# Patient Record
Sex: Male | Born: 2000 | Race: Black or African American | Hispanic: No | Marital: Single | Smoking: Current some day smoker
Health system: Southern US, Community
[De-identification: ages and names within clinical notes are randomized; demographics above are authoritative.]

## PROBLEM LIST (undated history)

## (undated) DIAGNOSIS — F909 Attention-deficit hyperactivity disorder, unspecified type: Secondary | ICD-10-CM

## (undated) HISTORY — PX: TONSILLECTOMY: SUR1361

---
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
## 2020-07-26 IMAGING — CR RIGHT HAND - COMPLETE 3+ VIEW
3 series · 3 of 3 positions shown · non-contrast
Comparison: Radiographs April 03, 2018.

CLINICAL DATA: Right hand pain and swelling after punching wall 2
days ago.

EXAM:
RIGHT HAND - COMPLETE 3+ VIEW

[x hand pa right]
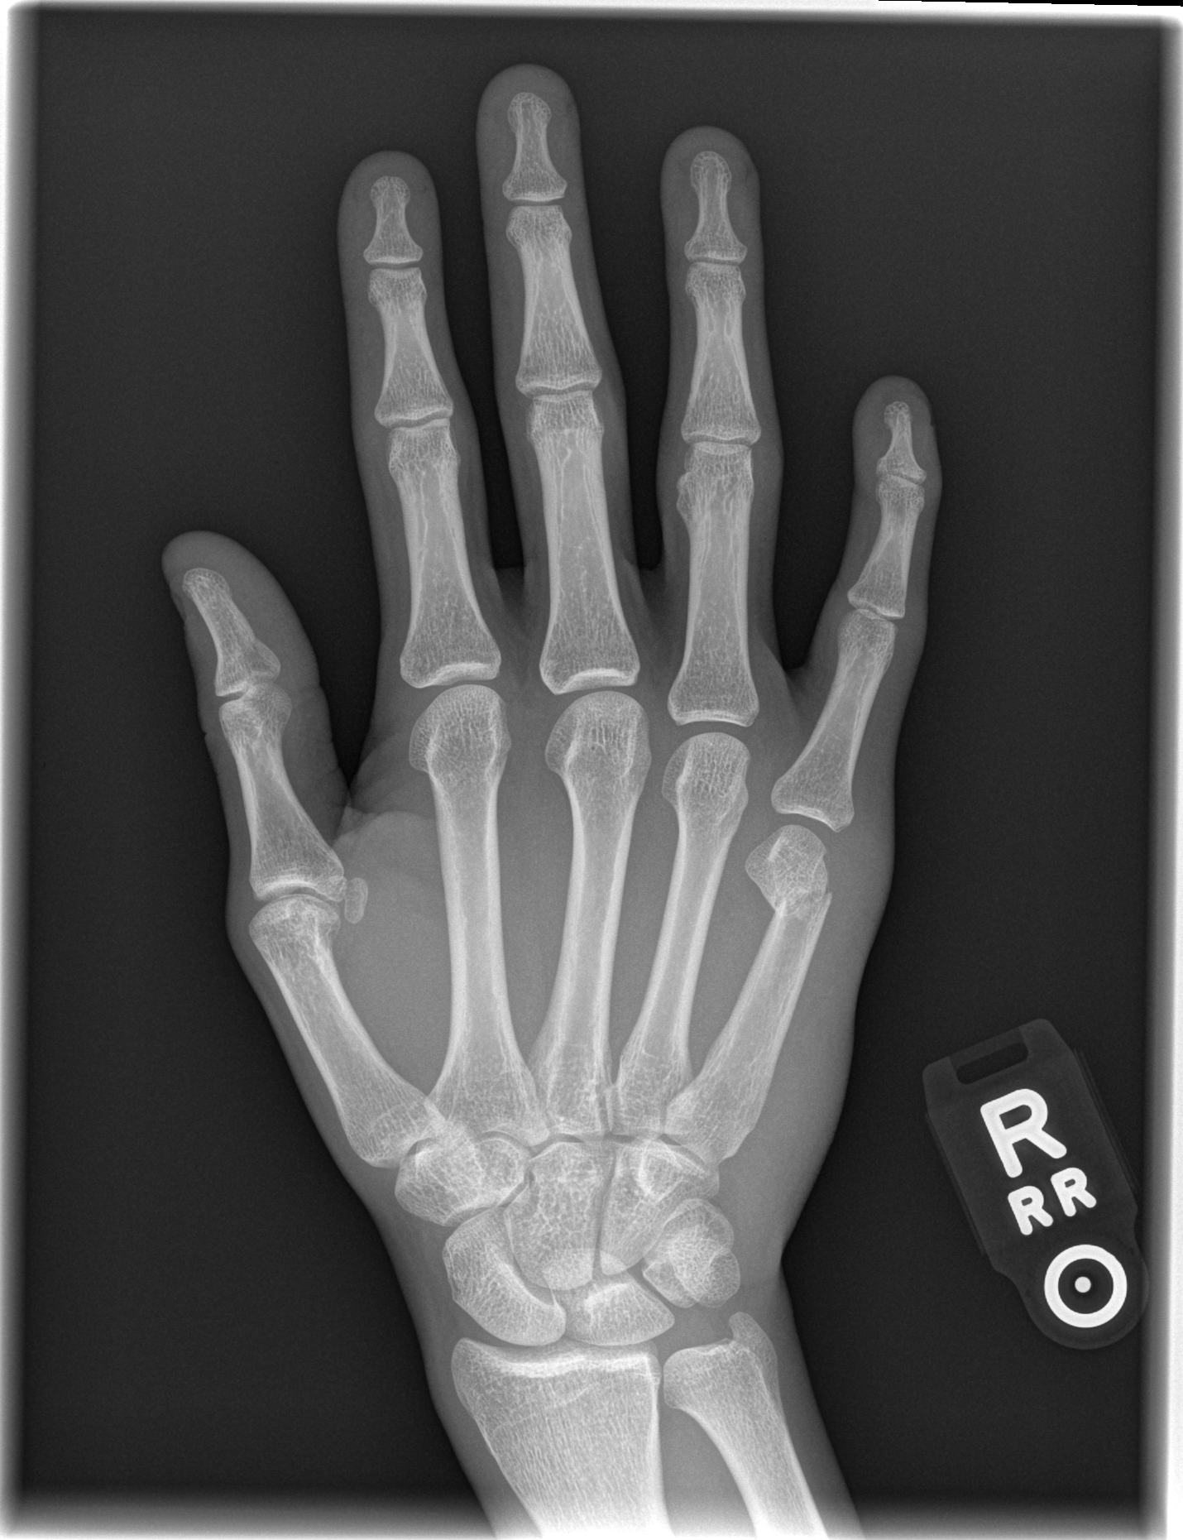

[x hand oblique right]
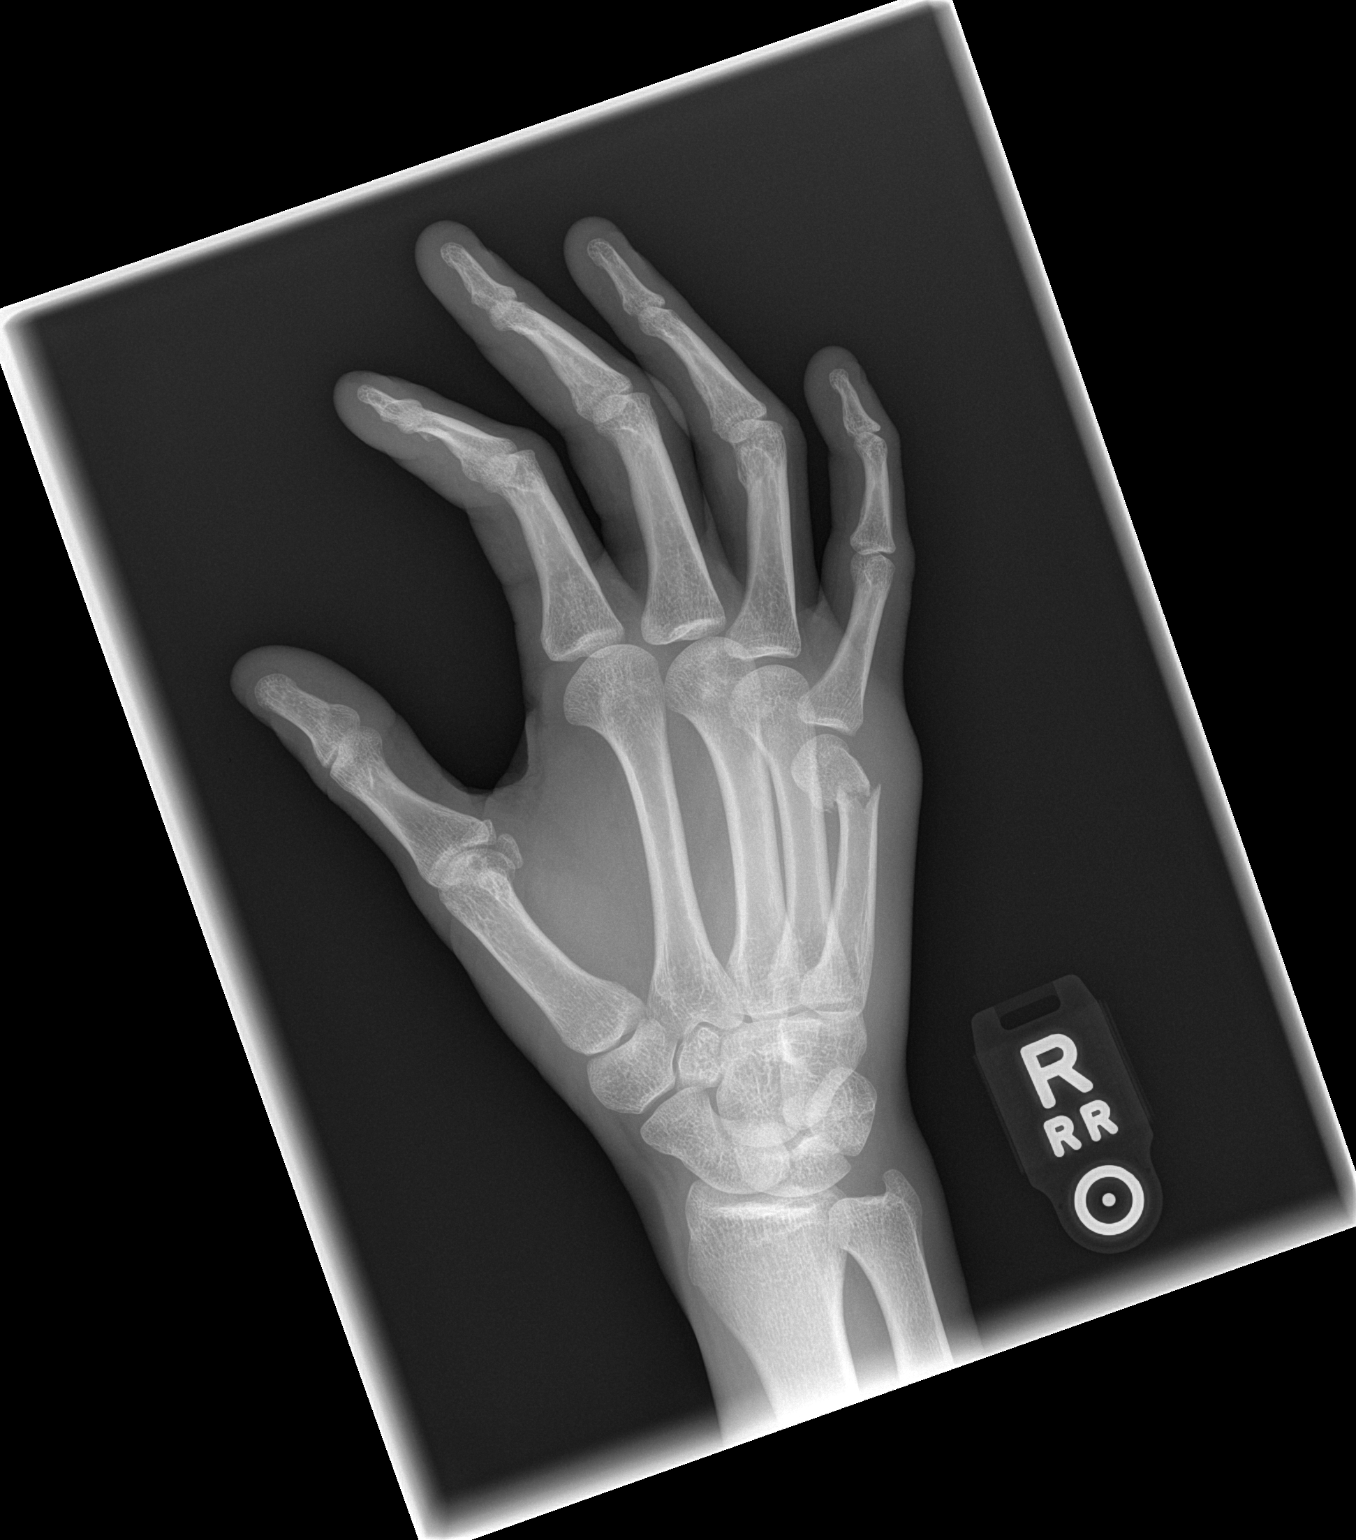

[x hand lat right]
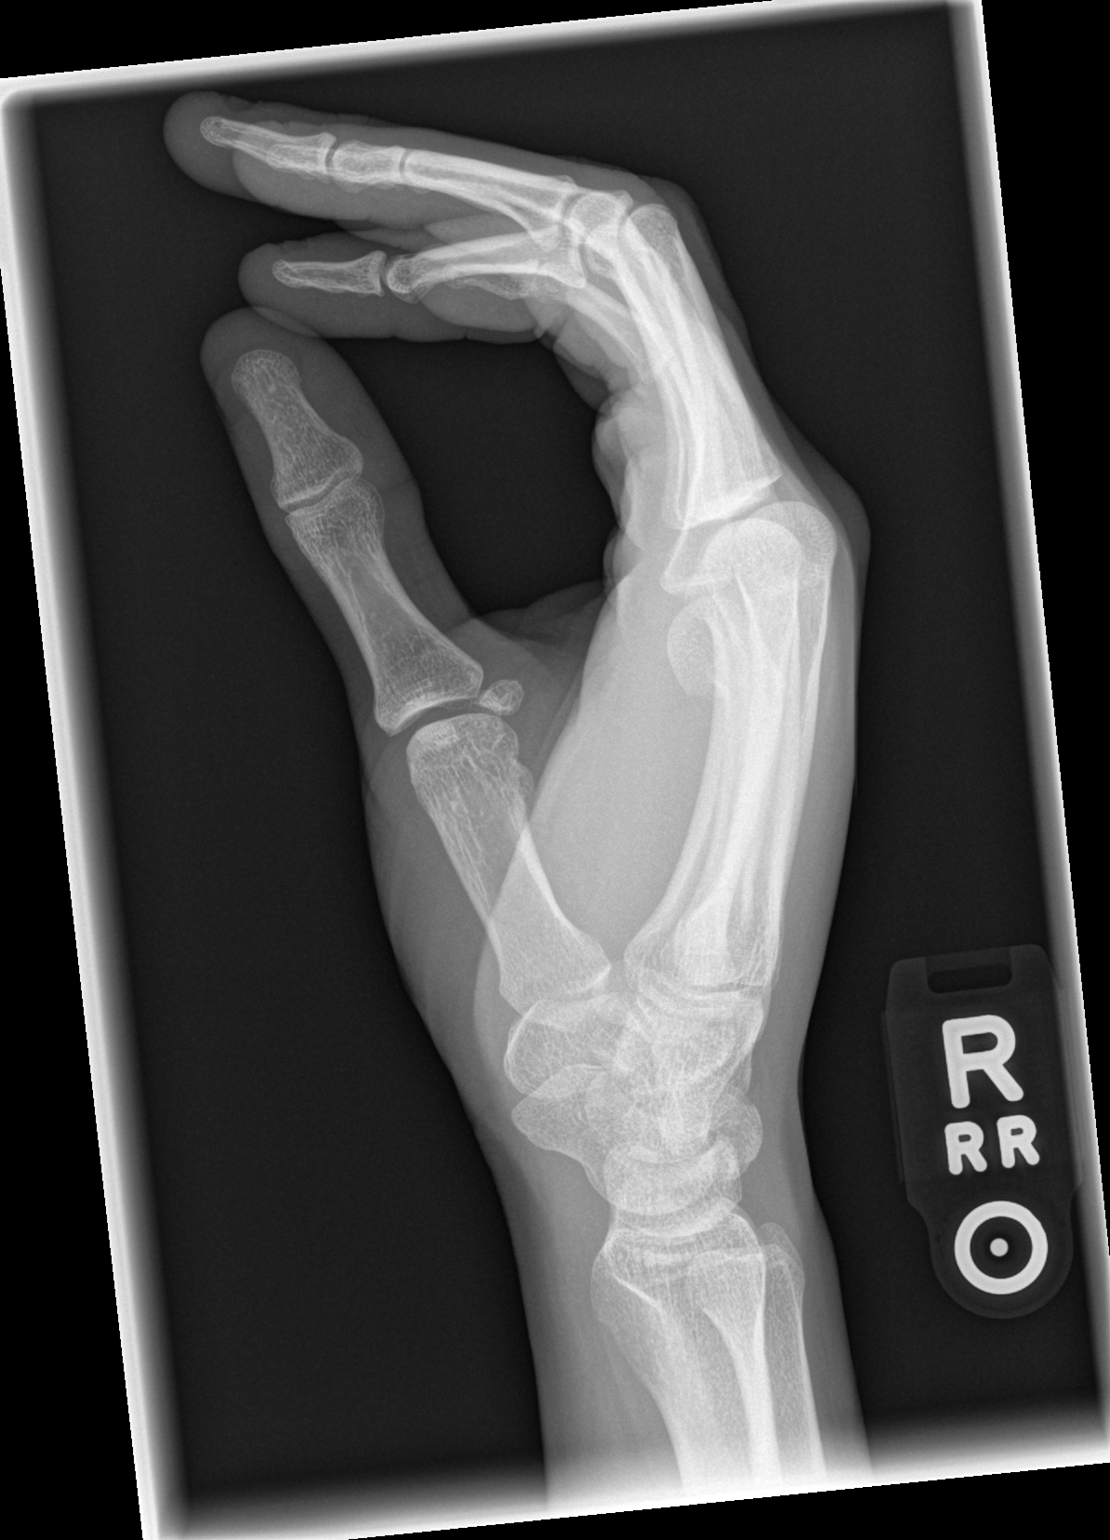

[3 of 3 positions shown; findings below may reference images not displayed]

FINDINGS: Moderately angulated fracture is seen involving the distal fifth
metacarpal. No other bony abnormality is noted. Joint spaces are
intact. No soft tissue abnormality is noted.
IMPRESSION: Moderately angulated distal fifth metacarpal fracture.

## 2023-01-02 ENCOUNTER — Emergency Department (HOSPITAL_COMMUNITY)
Admission: EM | Admit: 2023-01-02 | Discharge: 2023-01-02 | Disposition: A | Discharge status: Home / Self Care | Payer: BLUE CROSS/BLUE SHIELD | Attending: Student | Admitting: Student

## 2023-01-02 ENCOUNTER — Encounter (HOSPITAL_COMMUNITY): Payer: Self-pay

## 2023-01-02 ENCOUNTER — Emergency Department (HOSPITAL_COMMUNITY): Payer: BLUE CROSS/BLUE SHIELD

## 2023-01-02 ENCOUNTER — Other Ambulatory Visit: Payer: Self-pay

## 2023-01-02 DIAGNOSIS — Y9361 Activity, american tackle football: Secondary | ICD-10-CM | POA: Diagnosis not present

## 2023-01-02 DIAGNOSIS — S6992XA Unspecified injury of left wrist, hand and finger(s), initial encounter: Secondary | ICD-10-CM | POA: Diagnosis not present

## 2023-01-02 DIAGNOSIS — W1830XA Fall on same level, unspecified, initial encounter: Secondary | ICD-10-CM | POA: Insufficient documentation

## 2023-01-02 DIAGNOSIS — M25532 Pain in left wrist: Secondary | ICD-10-CM | POA: Diagnosis present

## 2023-01-02 NOTE — ED Triage Notes (Signed)
Patient reports he was playing football and fell on his left wrist/hand. Left hand is swollen. Denies numbness and tingling.

## 2023-01-02 NOTE — ED Provider Notes (Signed)
[facility] AT [facility] Provider Note   CSN: [id number] Arrival date & time: 01/02/23  1537     History  Chief Complaint  Patient presents with   Hand Injury    Craig Huffman is a 22 y.o. male with history of ADHD who presents to the ER complaining of left wrist pain x 3 days. He was playing football and fell, landing onto his left wrist. Not sure if it was fully extended or not. Has been using ACE wrap, ibuprofen and tylenol, and ice. Denies numbness or tingling. Denies any other injuries.    Hand Injury      Home Medications Prior to Admission medications   Medication Sig Start Date End Date Taking? Authorizing Provider  HYDROcodone-acetaminophen (NORCO/VICODIN) 5-325 MG tablet Take 1-2 tablets by mouth every 6 (six) hours as needed. 09/21/18   Benjiman Core, MD  ibuprofen (ADVIL,MOTRIN) 600 MG tablet Take 1 tablet (600 mg total) by mouth every 6 (six) hours as needed for mild pain (pain scale 3-6 of 10 if Tylenol ineffective). 06/30/17   Dozier-Lineberger, Mayah M, NP  oxyCODONE (OXY IR/ROXICODONE) 5 MG immediate release tablet Take 1 tablet (5 mg total) by mouth every 4 (four) hours as needed for up to 3 doses for moderate pain (pain scale 6-8 of 10). 06/30/17   Dozier-Lineberger, Bonney Roussel, NP      Allergies    Patient has no known allergies.    Review of Systems   Review of Systems  Physical Exam Updated Vital Signs BP 125/78 (BP Location: Right Arm)   Pulse 68   Temp 98 F (36.7 C) (Oral)   Resp 16   Ht 6' (1.829 m)   Wt 72.6 kg   SpO2 99%   BMI 21.70 kg/m  Physical Exam Vitals and nursing note reviewed.  Constitutional:      Appearance: Normal appearance.  HENT:     Head: Normocephalic and atraumatic.  Eyes:     Conjunctiva/sclera: Conjunctivae normal.  Pulmonary:     Effort: Pulmonary effort is normal. No respiratory distress.  Musculoskeletal:     Comments: Pain with left wrist flexion, adduction, and supination.  No bony deformities or focal tenderness to palpation.   Skin:    General: Skin is warm and dry.  Neurological:     Mental Status: He is alert.  Psychiatric:        Mood and Affect: Mood normal.        Behavior: Behavior normal.     ED Results / Procedures / Treatments   Labs (all labs ordered are listed, but only abnormal results are displayed) Labs Reviewed - No data to display  EKG None  Radiology DG Wrist Complete Left  Result Date: 01/02/2023 CLINICAL DATA:  Fall playing football. Fell onto left wrist and hand. EXAM: LEFT WRIST - COMPLETE 3+ VIEW COMPARISON:  None Available. FINDINGS: There is no evidence of fracture or dislocation. There is no evidence of arthropathy or other focal bone abnormality. Soft tissues are unremarkable. IMPRESSION: Negative. Electronically Signed   By: Amie Portland M.D.   On: 01/02/2023 16:54    Procedures Procedures    Medications Ordered in ED Medications - No data to display  ED Course/ Medical Decision Making/ A&P                                 Medical Decision Making Amount and/or Complexity of  Data Reviewed Radiology: ordered.   This patient is a 22 y.o. male  who presents to the ED for concern of left wrist pain after injury.   Differential diagnoses prior to evaluation: The emergent differential diagnosis includes, but is not limited to,  fracture, dislocation, ligamentous injury . This is not an exhaustive differential.   Past Medical History / Co-morbidities / Social History: ADHD  Physical Exam: Physical exam performed. The pertinent findings include: Pain with ranging the left wrist to flexion and abduction.  Normal ROM of the digits.  Neurovascularly intact.  Lab Tests/Imaging studies: I personally interpreted labs/imaging and the pertinent results include:  XR of left wrist without traumatic findings. I agree with the radiologist interpretation.  Disposition: After consideration of the diagnostic results and the  patients response to treatment, I feel that emergency department workup does not suggest an emergent condition requiring admission or immediate intervention beyond what has been performed at this time. The plan is: discharge to home with reassurance, symptomatic management of likely left wrist sprain. Given wrist brace, recommend NSAIDs and ice for swelling. Given orthopedic follow up if it does not improve.  The patient is safe for discharge and has been instructed to return immediately for worsening symptoms, change in symptoms or any other concerns.  Final Clinical Impression(s) / ED Diagnoses Final diagnoses:  Injury of left wrist, initial encounter    Rx / DC Orders ED Discharge Orders     None      Portions of this report may have been transcribed using voice recognition software. Every effort was made to ensure accuracy; however, inadvertent computerized transcription errors may be present.    Jeanella Flattery 01/02/23 1753    Glendora Score, MD 01/02/23 [phone]

## 2023-01-02 NOTE — Discharge Instructions (Addendum)
You were seen in the ER today for a wrist injury.  As we discussed, your x-rays did not show any broken or dislocated bones.  I suspect that you sprained your wrist.  We have placed this in a brace for you, that you can wear while doing activities.  You can take ibuprofen and/or Tylenol as needed for pain.  I recommend using ice as needed for swelling.  If you have had no improvement with this treatment in a week, I recommend following up with the orthopedist, and have attached their contact information.  Continue to monitor how you're doing and return to the ER for new or worsening symptoms.
